# Patient Record
Sex: Female | Born: 1950 | Race: Black or African American | Hispanic: No | Marital: Married | Smoking: Former smoker
Health system: Southern US, Community
[De-identification: ages and names within clinical notes are randomized; demographics above are authoritative.]

## PROBLEM LIST (undated history)

## (undated) DIAGNOSIS — K219 Gastro-esophageal reflux disease without esophagitis: Secondary | ICD-10-CM

## (undated) HISTORY — PX: ABDOMINAL HYSTERECTOMY: SHX81

## (undated) HISTORY — PX: SINUS EXPLORATION: SHX5214

---

## 2008-11-02 ENCOUNTER — Ambulatory Visit (HOSPITAL_BASED_OUTPATIENT_CLINIC_OR_DEPARTMENT_OTHER): Admission: RE | Admit: 2008-11-02 | Discharge: 2008-11-02 | Payer: Self-pay | Admitting: Internal Medicine

## 2008-11-02 ENCOUNTER — Ambulatory Visit: Payer: Self-pay | Admitting: Diagnostic Radiology

## 2016-02-10 ENCOUNTER — Encounter (HOSPITAL_BASED_OUTPATIENT_CLINIC_OR_DEPARTMENT_OTHER): Payer: Self-pay | Admitting: *Deleted

## 2016-02-10 ENCOUNTER — Emergency Department (HOSPITAL_BASED_OUTPATIENT_CLINIC_OR_DEPARTMENT_OTHER)
Admission: EM | Admit: 2016-02-10 | Discharge: 2016-02-10 | Disposition: A | Discharge status: Home / Self Care | Payer: Medicare Other | Attending: Emergency Medicine | Admitting: Emergency Medicine

## 2016-02-10 DIAGNOSIS — M549 Dorsalgia, unspecified: Secondary | ICD-10-CM | POA: Diagnosis not present

## 2016-02-10 DIAGNOSIS — M25512 Pain in left shoulder: Secondary | ICD-10-CM | POA: Diagnosis not present

## 2016-02-10 DIAGNOSIS — Z5321 Procedure and treatment not carried out due to patient leaving prior to being seen by health care provider: Secondary | ICD-10-CM | POA: Insufficient documentation

## 2016-02-10 DIAGNOSIS — Z87891 Personal history of nicotine dependence: Secondary | ICD-10-CM | POA: Insufficient documentation

## 2016-02-10 DIAGNOSIS — M542 Cervicalgia: Secondary | ICD-10-CM | POA: Insufficient documentation

## 2016-02-10 NOTE — ED Provider Notes (Signed)
Blood pressure 138/96, pulse 82, temperature 98.6 F (37 C), temperature source Oral, resp. rate 18, height 5\' 6"  (1.676 m), weight 79.4 kg, SpO2 100 %.  Laura Brewer is a 66 y.o. female who left without being seen. I did not participate in the care of this patient.    Wynetta Emeryicole Fabion Gatson, PA-C 02/10/16 1225    Laurence Spatesachel Morgan Little, MD 02/11/16 [phone]

## 2016-02-10 NOTE — ED Triage Notes (Signed)
Pt reports left upper back pain, left shoulder pain and left sided neck pain x 3 days, denies injury or trauma, tender to palp at triage, increases with positioning and movements, her husband massaged it last night with some relief.

## 2017-09-18 ENCOUNTER — Emergency Department (HOSPITAL_BASED_OUTPATIENT_CLINIC_OR_DEPARTMENT_OTHER): Payer: Medicare Other

## 2017-09-18 ENCOUNTER — Other Ambulatory Visit: Payer: Self-pay

## 2017-09-18 ENCOUNTER — Encounter (HOSPITAL_BASED_OUTPATIENT_CLINIC_OR_DEPARTMENT_OTHER): Payer: Self-pay | Admitting: Emergency Medicine

## 2017-09-18 ENCOUNTER — Emergency Department (HOSPITAL_BASED_OUTPATIENT_CLINIC_OR_DEPARTMENT_OTHER)
Admission: EM | Admit: 2017-09-18 | Discharge: 2017-09-18 | Disposition: A | Discharge status: Home / Self Care | Payer: Medicare Other | Attending: Emergency Medicine | Admitting: Emergency Medicine

## 2017-09-18 DIAGNOSIS — Z87891 Personal history of nicotine dependence: Secondary | ICD-10-CM | POA: Diagnosis not present

## 2017-09-18 DIAGNOSIS — R202 Paresthesia of skin: Secondary | ICD-10-CM | POA: Diagnosis present

## 2017-09-18 HISTORY — DX: Gastro-esophageal reflux disease without esophagitis: K21.9

## 2017-09-18 LAB — CBC WITH DIFFERENTIAL/PLATELET
Basophils Absolute: 0 10*3/uL (ref 0.0–0.1)
Basophils Relative: 0 %
EOS PCT: 1 %
Eosinophils Absolute: 0.1 10*3/uL (ref 0.0–0.7)
HCT: 38.9 % (ref 36.0–46.0)
Hemoglobin: 13.1 g/dL (ref 12.0–15.0)
LYMPHS ABS: 1.7 10*3/uL (ref 0.7–4.0)
LYMPHS PCT: 25 %
MCH: 30.6 pg (ref 26.0–34.0)
MCHC: 33.7 g/dL (ref 30.0–36.0)
MCV: 90.9 fL (ref 78.0–100.0)
MONO ABS: 0.6 10*3/uL (ref 0.1–1.0)
MONOS PCT: 8 %
Neutro Abs: 4.6 10*3/uL (ref 1.7–7.7)
Neutrophils Relative %: 66 %
PLATELETS: 213 10*3/uL (ref 150–400)
RBC: 4.28 MIL/uL (ref 3.87–5.11)
RDW: 13 % (ref 11.5–15.5)
WBC: 7 10*3/uL (ref 4.0–10.5)

## 2017-09-18 LAB — LIPASE, BLOOD: Lipase: 36 U/L (ref 11–51)

## 2017-09-18 LAB — COMPREHENSIVE METABOLIC PANEL
ALK PHOS: 58 U/L (ref 38–126)
ALT: 13 U/L (ref 0–44)
AST: 20 U/L (ref 15–41)
Albumin: 3.9 g/dL (ref 3.5–5.0)
Anion gap: 9 (ref 5–15)
BUN: 13 mg/dL (ref 8–23)
CALCIUM: 9 mg/dL (ref 8.9–10.3)
CHLORIDE: 105 mmol/L (ref 98–111)
CO2: 27 mmol/L (ref 22–32)
CREATININE: 0.91 mg/dL (ref 0.44–1.00)
GFR calc Af Amer: >60 mL/min (ref >60)
GFR calc non Af Amer: >60 mL/min (ref >60)
Glucose, Bld: 86 mg/dL (ref 70–99)
Potassium: 3.5 mmol/L (ref 3.5–5.1)
SODIUM: 141 mmol/L (ref 135–145)
Total Bilirubin: 0.6 mg/dL (ref 0.3–1.2)
Total Protein: 7 g/dL (ref 6.5–8.1)

## 2017-09-18 LAB — TROPONIN I

## 2017-09-18 NOTE — ED Triage Notes (Addendum)
Has been burning to mid back area x 3 days and today started to left arm, burning pain . Burning pain to right arm for 3 days . Also productive cough and epigastric pain

## 2017-09-18 NOTE — Discharge Instructions (Addendum)
Work-up today without any acute findings.  Recommend follow-up with neurology.  Call on Monday to set up an appointment.  In the meantime do recommend following up with your regular doctor.  Return for any new or worse symptoms.

## 2017-09-18 NOTE — ED Provider Notes (Signed)
[facility] Provider Note   CSN: [id number] Arrival date & time: 09/18/17  1128     History   Chief Complaint Chief Complaint  Patient presents with  . Abdominal Pain    HPI Laura Brewer is a 67 y.o. female.  Patient presents with a first seem to be a myriad of complaints.  But the main complaint is that she started having like a tingling or burning sensation in the right shoulder yesterday afternoon that seemed to come from the mid thoracic back area.  Then eventually spread down the right arm and then also involves the right leg and then she had a little bit of it in the left arm.  Never said anything happen like this before.  She is had a little bit of a productive cough and some mild epigastric pain.  But patient does have history of reflux but does not normally do anything like this.  However she usually does cough up some phlegm each morning.  She is a former smoker and using e-cigarettes.  But patient not having significant pulmonary complaints.     Past Medical History:  Diagnosis Date  . GERD (gastroesophageal reflux disease)     There are no active problems to display for this patient.   Past Surgical History:  Procedure Laterality Date  . ABDOMINAL HYSTERECTOMY    . SINUS EXPLORATION       OB History   None      Home Medications    Prior to Admission medications   Not on File    Family History No family history on file.  Social History Social History   Tobacco Use  . Smoking status: Former Smoker    Types: E-cigarettes  . Smokeless tobacco: Never Used  Substance Use Topics  . Alcohol use: Never    Frequency: Never  . Drug use: Never     Allergies   Patient has no known allergies.   Review of Systems Review of Systems  Constitutional: Negative for fever.  HENT: Negative for congestion, sore throat and trouble swallowing.   Eyes: Negative for visual disturbance.  Respiratory: Positive for cough.  Negative for shortness of breath.   Cardiovascular: Negative for chest pain.  Gastrointestinal: Negative for abdominal pain, diarrhea, nausea and vomiting.  Genitourinary: Negative for dysuria.  Musculoskeletal: Positive for back pain. Negative for neck pain.  Skin: Negative for rash and wound.  Neurological: Negative for dizziness, tremors, seizures, syncope, facial asymmetry, speech difficulty, weakness, light-headedness, numbness and headaches.  Hematological: Does not bruise/bleed easily.     Physical Exam Updated Vital Signs BP (!) 166/81 (BP Location: Right Arm)   Pulse (!) 50   Temp 98.1 F (36.7 C) (Oral)   Resp 18   Ht 1.676 m (_0 )   Wt 77.1 kg   LMP  (LMP Unknown) Comment: hysterectomy  SpO2 99%   BMI 27.44 kg/m   Physical Exam  Constitutional: She is oriented to person, place, and time. She appears well-developed and well-nourished. No distress.  HENT:  Head: Normocephalic and atraumatic.  Mouth/Throat: Oropharynx is clear and moist.  Eyes: Pupils are equal, round, and reactive to light. Conjunctivae and EOM are normal.  Neck: Normal range of motion. Neck supple.  Cardiovascular: Normal rate, regular rhythm and normal heart sounds.  Pulmonary/Chest: Effort normal and breath sounds normal. No respiratory distress.  Abdominal: Soft. Bowel sounds are normal. There is no tenderness.  Musculoskeletal: Normal range of motion. She exhibits no edema or  deformity.  Neurological: She is alert and oriented to person, place, and time. No cranial nerve deficit or sensory deficit. She exhibits normal muscle tone. Coordination normal.  Skin: Skin is warm. Capillary refill takes less than 2 seconds. No rash noted. No erythema.  Nursing note and vitals reviewed.    ED Treatments / Results  Labs (all labs ordered are listed, but only abnormal results are displayed) Labs Reviewed  TROPONIN I  COMPREHENSIVE METABOLIC PANEL  LIPASE, BLOOD  CBC WITH DIFFERENTIAL/PLATELET    Results for orders placed or performed during the hospital encounter of 09/18/17  Troponin I  Result Value Ref Range   Troponin I <0.03 <0.03 ng/mL  Comprehensive metabolic panel  Result Value Ref Range   Sodium 141 135 - 145 mmol/L   Potassium 3.5 3.5 - 5.1 mmol/L   Chloride 105 98 - 111 mmol/L   CO2 27 22 - 32 mmol/L   Glucose, Bld 86 70 - 99 mg/dL   BUN 13 8 - 23 mg/dL   Creatinine, Ser 0.91 0.44 - 1.00 mg/dL   Calcium 9.0 8.9 - 10.3 mg/dL   Total Protein 7.0 6.5 - 8.1 g/dL   Albumin 3.9 3.5 - 5.0 g/dL   AST 20 15 - 41 U/L   ALT 13 0 - 44 U/L   Alkaline Phosphatase 58 38 - 126 U/L   Total Bilirubin 0.6 0.3 - 1.2 mg/dL   GFR calc non Af Amer >60 >60 mL/min   GFR calc Af Amer >60 >60 mL/min   Anion gap 9 5 - 15  Lipase, blood  Result Value Ref Range   Lipase 36 11 - 51 U/L  CBC with Differential/Platelet  Result Value Ref Range   WBC 7.0 4.0 - 10.5 K/uL   RBC 4.28 3.87 - 5.11 MIL/uL   Hemoglobin 13.1 12.0 - 15.0 g/dL   HCT 38.9 36.0 - 46.0 %   MCV 90.9 78.0 - 100.0 fL   MCH 30.6 26.0 - 34.0 pg   MCHC 33.7 30.0 - 36.0 g/dL   RDW 13.0 11.5 - 15.5 %   Platelets 213 150 - 400 K/uL   Neutrophils Relative % 66 %   Neutro Abs 4.6 1.7 - 7.7 K/uL   Lymphocytes Relative 25 %   Lymphs Abs 1.7 0.7 - 4.0 K/uL   Monocytes Relative 8 %   Monocytes Absolute 0.6 0.1 - 1.0 K/uL   Eosinophils Relative 1 %   Eosinophils Absolute 0.1 0.0 - 0.7 K/uL   Basophils Relative 0 %   Basophils Absolute 0.0 0.0 - 0.1 K/uL     EKG EKG Interpretation  Date/Time:  Saturday September 18 2017 11:42:12 EDT Ventricular Rate:  53 PR Interval:    QRS Duration: 87 QT Interval:  458 QTC Calculation: 430 R Axis:   4 Text Interpretation:  Sinus rhythm Atrial premature complex No previous ECGs available Confirmed by Fredia Sorrow [id number]) on 09/18/2017 12:00:34 PM   Radiology Dg Chest 2 View  Result Date: 09/18/2017 CLINICAL DATA:  Chest pain for several hours EXAM: CHEST - 2 VIEW  COMPARISON:  None. FINDINGS: Cardiac shadow is enlarged. Mild aortic calcifications are noted. The lungs are clear bilaterally. Degenerative changes of the thoracic spine are seen. IMPRESSION: No acute abnormality noted. Aortic Atherosclerosis (ICD10-I70.0). Electronically Signed   By: Inez Catalina M.D.   On: 09/18/2017 12:37   Ct Head Wo Contrast  Result Date: 09/18/2017 CLINICAL DATA:  Pt with bilateral upper extremity tingling/numbness/burning, no headache, no dizziness, symptoms  started 3 days ago EXAM: CT HEAD WITHOUT CONTRAST TECHNIQUE: Contiguous axial images were obtained from the base of the skull through the vertex without intravenous contrast. COMPARISON:  None. FINDINGS: Brain: Ventricles are within normal limits in size and configuration. Minimal chronic small vessel ischemic changes within the deep periventricular white matter regions bilaterally. No mass, hemorrhage, edema or other evidence of acute parenchymal abnormality. No extra-axial hemorrhage. Vascular: Chronic calcified atherosclerotic changes of the large vessels at the skull base. No unexpected hyperdense vessel. Skull: Normal. Negative for fracture or focal lesion. Sinuses/Orbits: No acute finding. Other: None. IMPRESSION: No acute findings.  No intracranial mass, hemorrhage or edema. Electronically Signed   By: Franki Cabot M.D.   On: 09/18/2017 13:42    Procedures Procedures (including critical care time)  Medications Ordered in ED Medications - No data to display   Initial Impression / Assessment and Plan / ED Course  I have reviewed the triage vital signs and the nursing notes.  Pertinent labs & imaging results that were available during my care of the patient were reviewed by me and considered in my medical decision making (see chart for details).    Work-up for the tingling.  No numbness no weakness just a tingling burning sensation without any acute findings.  CT head negative chest x-ray negative basic labs  without any significant abnormalities.  EKG without any significant abnormalities.  Will refer patient to neurology for follow-up patient will return for any new or worse symptoms.  Noted the patient does use e-cigarettes.  But I do not think it is directly related certainly not having any major pulmonary issues.   Final Clinical Impressions(s) / ED Diagnoses   Final diagnoses:  Tingling in extremities    ED Discharge Orders    None       Fredia Sorrow, MD 09/18/17 1504

## 2019-03-22 IMAGING — CT CT HEAD W/O CM
3 series · 16 of 47 positions shown, 19 images · non-contrast
Comparison: None.

CLINICAL DATA: Pt with bilateral upper extremity
tingling/numbness/burning, no headache, no dizziness, symptoms
started 3 days ago

EXAM:
CT HEAD WITHOUT CONTRAST
TECHNIQUE: Contiguous axial images were obtained from the base of the skull
through the vertex without intravenous contrast.

[Series 2: head wo · axial · 0.49mm/px · z∈[-155,-30]mm · 10 of 30 slices shown, 13 images]
[im 3/30  brain]
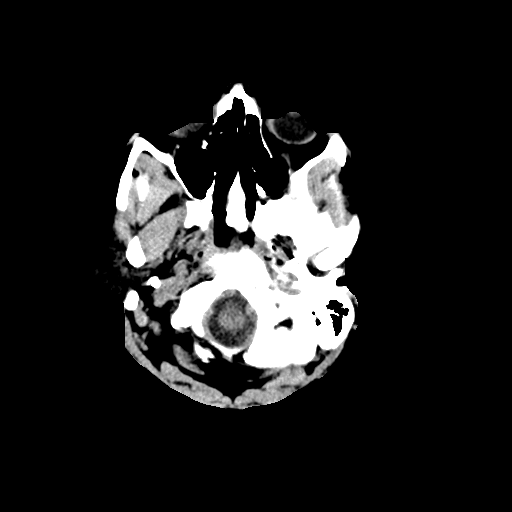
[im 3/30  bone]
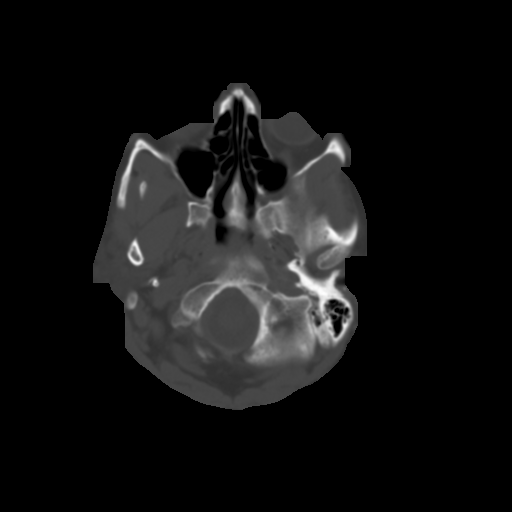
[im 6/30  brain]
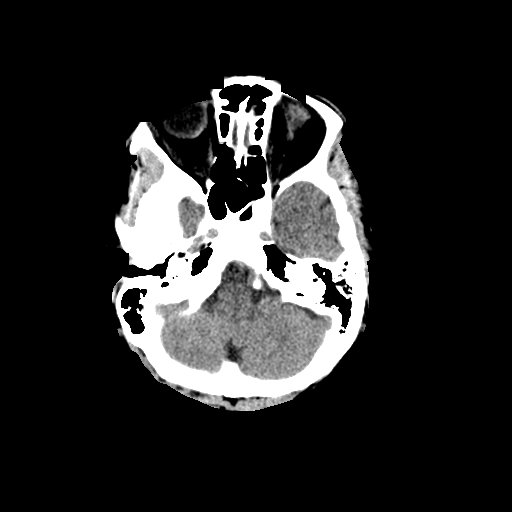
[im 9/30  brain]
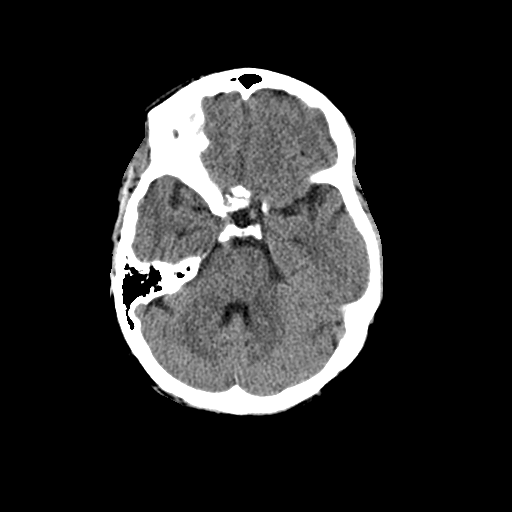
[im 11/30  brain]
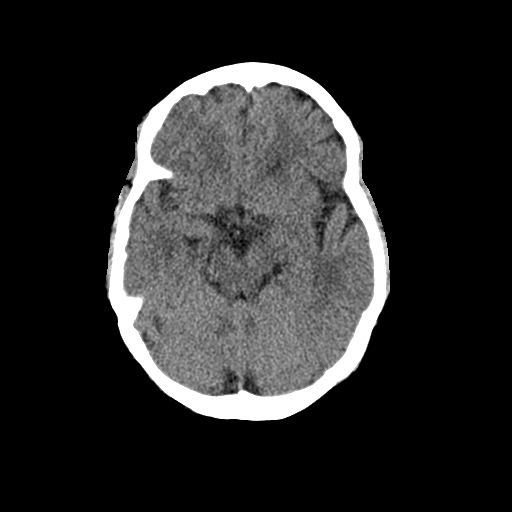
[im 14/30  brain]
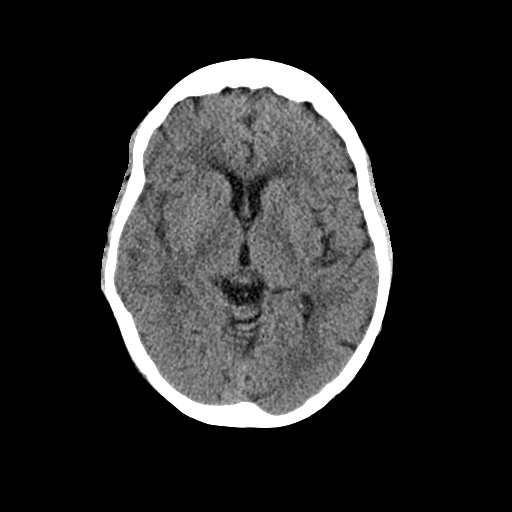
[im 14/30  bone]
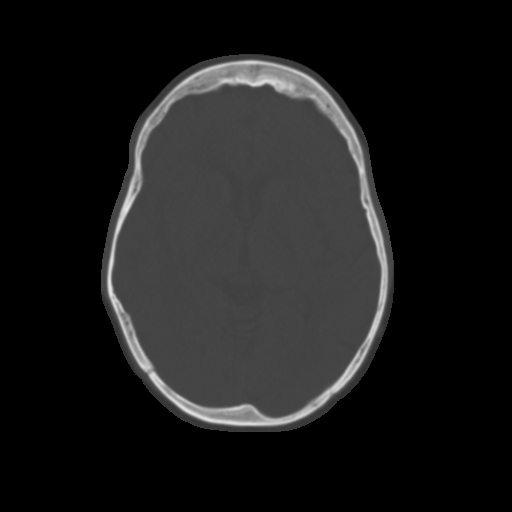
[im 17/30  brain]
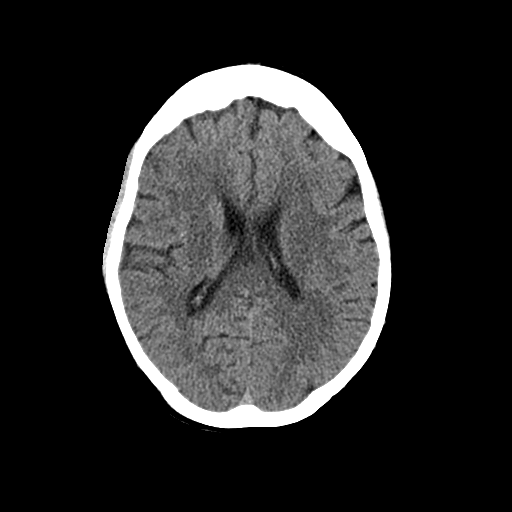
[im 20/30  brain]
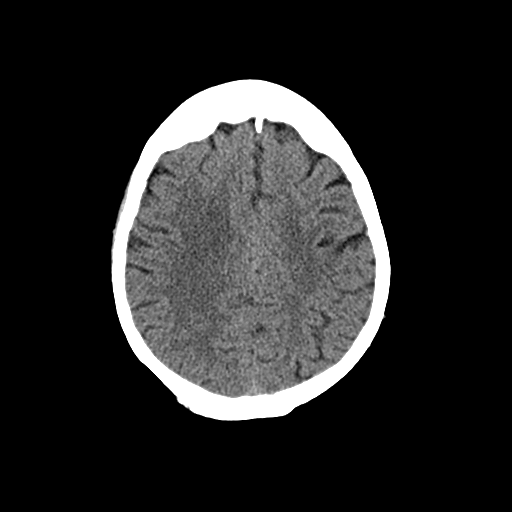
[im 23/30  brain]
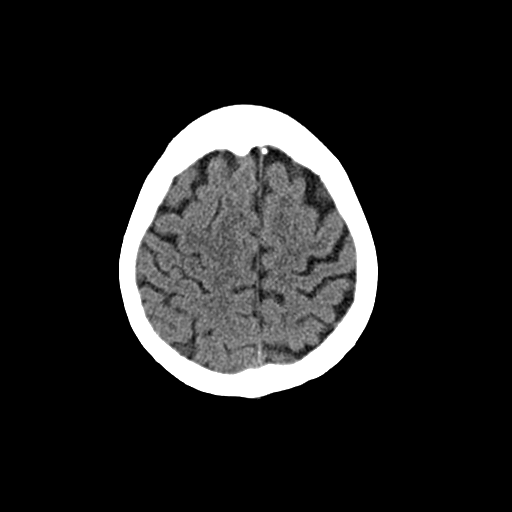
[im 25/30  brain]
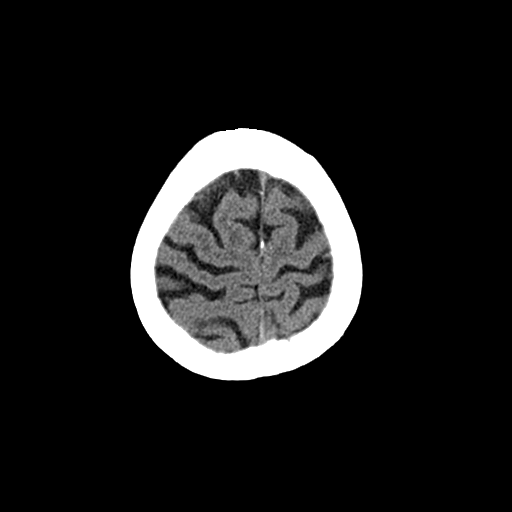
[im 25/30  bone]
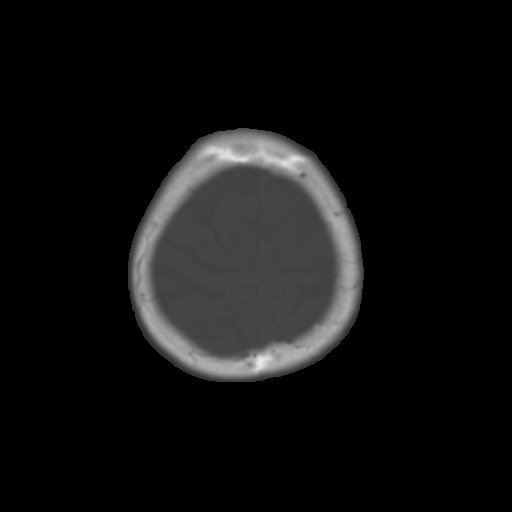
[im 28/30  brain]
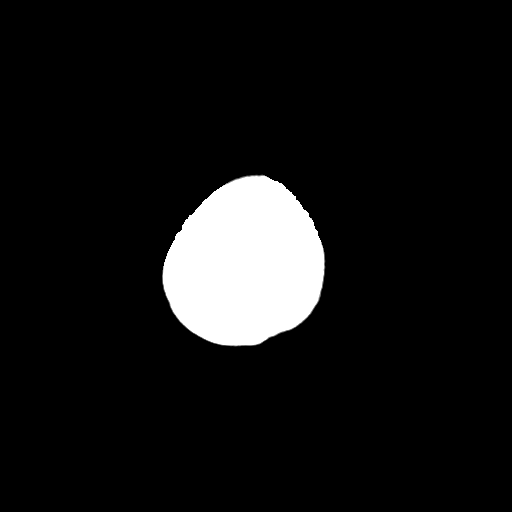

[Series 4: cor soft · coronal · 0.35mm/px · 3 of 60 slices shown]
[im 20/60  brain]
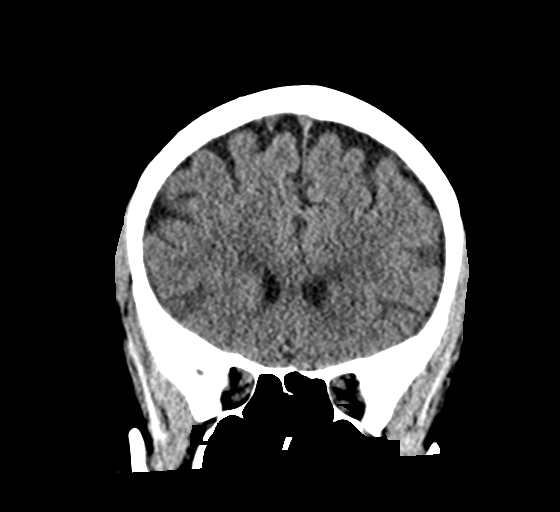
[im 27/60  brain]
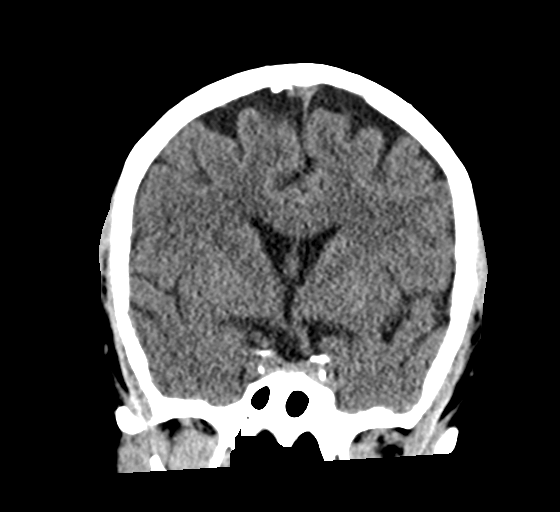
[im 33/60  brain]
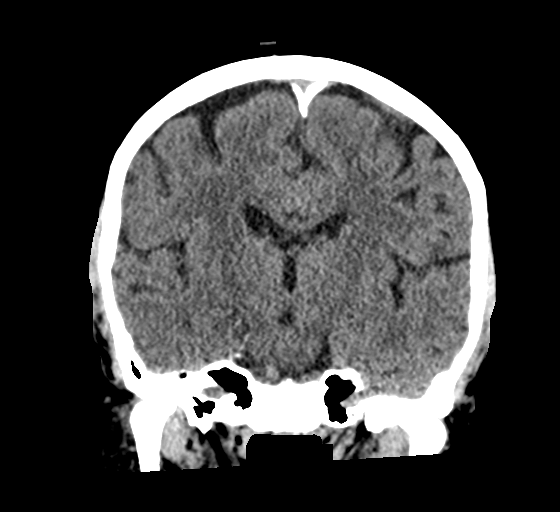

[Series 5: sag soft · sagittal · 0.35mm/px · 3 of 54 slices shown]
[im 18/54  brain]
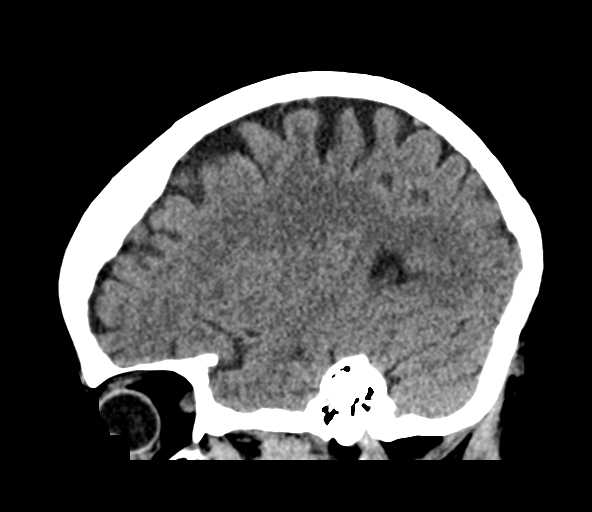
[im 27/54  brain]
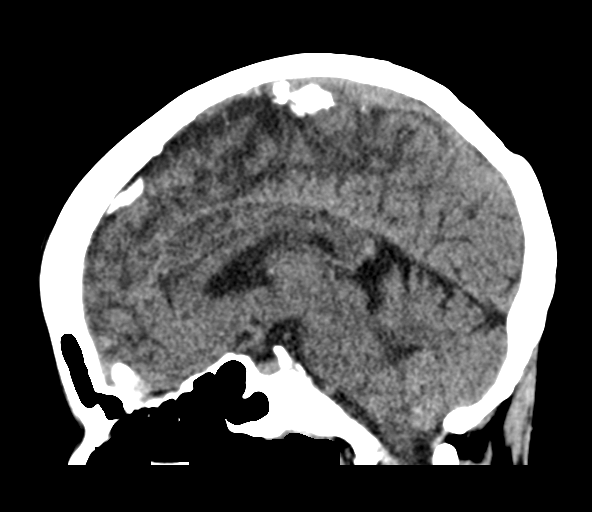
[im 36/54  brain]
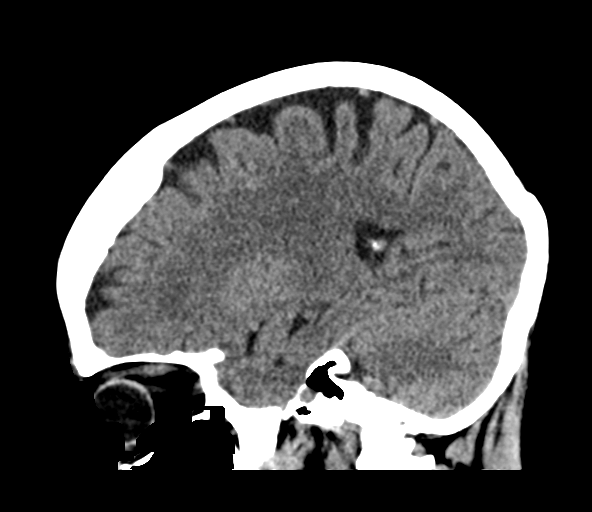

[16 of 47 positions shown; findings below may reference images not displayed]

FINDINGS: Brain: Ventricles are within normal limits in size and
configuration. Minimal chronic small vessel ischemic changes within
the deep periventricular white matter regions bilaterally.

No mass, hemorrhage, edema or other evidence of acute parenchymal
abnormality. No extra-axial hemorrhage.

Vascular: Chronic calcified atherosclerotic changes of the large
vessels at the skull base. No unexpected hyperdense vessel.

Skull: Normal. Negative for fracture or focal lesion.

Sinuses/Orbits: No acute finding.

Other: None.
IMPRESSION: No acute findings.  No intracranial mass, hemorrhage or edema.
